# Patient Record
Sex: Female | Born: 1995 | Race: White | Hispanic: No | Marital: Single | Smoking: Never smoker
Health system: Southern US, Community
[De-identification: ages and names within clinical notes are randomized; demographics above are authoritative.]

## PROBLEM LIST (undated history)

## (undated) DIAGNOSIS — L309 Dermatitis, unspecified: Secondary | ICD-10-CM

## (undated) HISTORY — DX: Dermatitis, unspecified: L30.9

---
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
## 2017-12-11 ENCOUNTER — Emergency Department (HOSPITAL_BASED_OUTPATIENT_CLINIC_OR_DEPARTMENT_OTHER): Payer: 59

## 2017-12-11 ENCOUNTER — Encounter (HOSPITAL_BASED_OUTPATIENT_CLINIC_OR_DEPARTMENT_OTHER): Payer: Self-pay | Admitting: *Deleted

## 2017-12-11 ENCOUNTER — Emergency Department (HOSPITAL_BASED_OUTPATIENT_CLINIC_OR_DEPARTMENT_OTHER)
Admission: EM | Admit: 2017-12-11 | Discharge: 2017-12-12 | Disposition: A | Discharge status: Home / Self Care | Payer: 59 | Attending: Emergency Medicine | Admitting: Emergency Medicine

## 2017-12-11 ENCOUNTER — Other Ambulatory Visit: Payer: Self-pay

## 2017-12-11 DIAGNOSIS — Y9301 Activity, walking, marching and hiking: Secondary | ICD-10-CM | POA: Insufficient documentation

## 2017-12-11 DIAGNOSIS — Y999 Unspecified external cause status: Secondary | ICD-10-CM | POA: Diagnosis not present

## 2017-12-11 DIAGNOSIS — R0602 Shortness of breath: Secondary | ICD-10-CM | POA: Diagnosis not present

## 2017-12-11 DIAGNOSIS — Z79899 Other long term (current) drug therapy: Secondary | ICD-10-CM | POA: Diagnosis not present

## 2017-12-11 DIAGNOSIS — Y9289 Other specified places as the place of occurrence of the external cause: Secondary | ICD-10-CM | POA: Diagnosis not present

## 2017-12-11 DIAGNOSIS — R000 Tachycardia, unspecified: Secondary | ICD-10-CM | POA: Insufficient documentation

## 2017-12-11 DIAGNOSIS — R202 Paresthesia of skin: Secondary | ICD-10-CM | POA: Diagnosis not present

## 2017-12-11 DIAGNOSIS — M79672 Pain in left foot: Secondary | ICD-10-CM | POA: Insufficient documentation

## 2017-12-11 DIAGNOSIS — E876 Hypokalemia: Secondary | ICD-10-CM | POA: Insufficient documentation

## 2017-12-11 DIAGNOSIS — X58XXXA Exposure to other specified factors, initial encounter: Secondary | ICD-10-CM | POA: Insufficient documentation

## 2017-12-11 LAB — BASIC METABOLIC PANEL
ANION GAP: 7 (ref 5–15)
BUN: 12 mg/dL (ref 6–20)
CO2: 25 mmol/L (ref 22–32)
CREATININE: 0.8 mg/dL (ref 0.44–1.00)
Calcium: 9.4 mg/dL (ref 8.9–10.3)
Chloride: 101 mmol/L (ref 101–111)
GFR calc non Af Amer: >60 mL/min (ref >60)
Glucose, Bld: 109 mg/dL — ABNORMAL HIGH (ref 65–99)
Potassium: 3.1 mmol/L — ABNORMAL LOW (ref 3.5–5.1)
Sodium: 133 mmol/L — ABNORMAL LOW (ref 135–145)

## 2017-12-11 LAB — CBC WITH DIFFERENTIAL/PLATELET
BASOS ABS: 0 10*3/uL (ref 0.0–0.1)
Basophils Relative: 0 %
Eosinophils Absolute: 0 10*3/uL (ref 0.0–0.7)
Eosinophils Relative: 0 %
HEMATOCRIT: 41.4 % (ref 36.0–46.0)
Hemoglobin: 14.2 g/dL (ref 12.0–15.0)
Lymphocytes Relative: 29 %
Lymphs Abs: 3.4 10*3/uL (ref 0.7–4.0)
MCH: 28.5 pg (ref 26.0–34.0)
MCHC: 34.3 g/dL (ref 30.0–36.0)
MCV: 83.1 fL (ref 78.0–100.0)
Monocytes Absolute: 1.1 10*3/uL — ABNORMAL HIGH (ref 0.1–1.0)
Monocytes Relative: 10 %
NEUTROS ABS: 7.1 10*3/uL (ref 1.7–7.7)
NEUTROS PCT: 61 %
Platelets: 379 10*3/uL (ref 150–400)
RBC: 4.98 MIL/uL (ref 3.87–5.11)
RDW: 12.3 % (ref 11.5–15.5)
WBC: 11.6 10*3/uL — ABNORMAL HIGH (ref 4.0–10.5)

## 2017-12-11 LAB — D-DIMER, QUANTITATIVE (NOT AT ARMC)

## 2017-12-11 MED ORDER — IBUPROFEN 800 MG PO TABS
800.0000 mg | ORAL_TABLET | Freq: Once | ORAL | Status: AC
  Administered 2017-12-11: 800 mg via ORAL
  Filled 2017-12-11: qty 1

## 2017-12-11 MED ORDER — POTASSIUM CHLORIDE CRYS ER 20 MEQ PO TBCR
40.0000 meq | EXTENDED_RELEASE_TABLET | Freq: Once | ORAL | Status: AC
  Administered 2017-12-12: 40 meq via ORAL
  Filled 2017-12-11: qty 2

## 2017-12-11 MED ORDER — SODIUM CHLORIDE 0.9 % IV BOLUS (SEPSIS)
1000.0000 mL | Freq: Once | INTRAVENOUS | Status: AC
  Administered 2017-12-11: 1000 mL via INTRAVENOUS

## 2017-12-11 NOTE — ED Triage Notes (Signed)
Left foot pain after walking in the park.

## 2017-12-11 NOTE — ED Provider Notes (Signed)
[facility] EMERGENCY DEPARTMENT Provider Note   CSN: [id number] Arrival date & time: 12/11/17  2028     History   Chief Complaint Chief Complaint  Patient presents with  . Foot Pain    HPI Laurie Cabrera is a 22 y.o. female.  HPI   22 year old female presenting for evaluation of left foot pain.  Patient reports she was walking with her friend in the park for approximately an hour when she developed pain to her left foot.  Pain is primarily to the ball of the foot radiates towards her toes.  She also complaining of tingling sensation to her toes.  Pain is described as 5 out of 10 currently and states that it has been improving.  She has not had this pain before.  She denies any recent injury.  Denies change in her footwear.  No history of diabetes.  No tenderness to her ankle or calf.  No specific treatment tried.  Past Medical History:  Diagnosis Date  . Eczema     There are no active problems to display for this patient.   History reviewed. No pertinent surgical history.  OB History    Gravida Para Term Preterm AB Living             0   SAB TAB Ectopic Multiple Live Births                   Home Medications    Prior to Admission medications   Medication Sig Start Date End Date Taking? Authorizing Provider  acetaminophen (TYLENOL) 500 MG tablet Take 500 mg by mouth every 6 (six) hours as needed for mild pain or moderate pain.    [provider]  NIKKI 3-0.02 MG tablet Take 1 tablet by mouth daily. 08/21/16   [provider]  triamcinolone cream (KENALOG) 0.1 % Apply 1 application topically 2 (two) times daily. 05/12/14   Junie SpencerHawks, Christy A, FNP    Family History Family History  Problem Relation Age of Onset  . Hypertension Mother   . Seizures Brother   . Healthy Brother     Social History Social History   Tobacco Use  . Smoking status: Never Smoker  . Smokeless tobacco: Never Used  Substance Use Topics  . Alcohol use: No  . Drug  use: No     Allergies   Patient has no known allergies.   Review of Systems Review of Systems  Constitutional: Negative for fever.  Skin: Negative for wound.  Neurological: Positive for numbness.     Physical Exam Updated Vital Signs BP (!) 144/57   Pulse (!) 120   Temp 98.1 F (36.7 C) (Oral)   Resp 18   Ht 5\' 4"  (1.626 m)   Wt 68 kg (150 lb)   LMP 12/07/2017   SpO2 100%   BMI 25.75 kg/m   Physical Exam  Constitutional: She appears well-developed and well-nourished. No distress.  HENT:  Head: Atraumatic.  Eyes: Conjunctivae are normal.  Neck: Neck supple.  Musculoskeletal: She exhibits tenderness (Left foot: Tenderness to left distal toes including left great toe on palpation with brisk cap refill and intact dorsalis pedis pulse.  No overlying skin changes, no bruising or deformity so.  Left ankle nontender).  Neurological: She is alert.  Skin: No rash noted.  Psychiatric: She has a normal mood and affect.  Nursing note and vitals reviewed.    ED Treatments / Results  Labs (all labs ordered are listed, but  only abnormal results are displayed) Labs Reviewed  CBC WITH DIFFERENTIAL/PLATELET - Abnormal; Notable for the following components:      Result Value   WBC 11.6 (*)    Monocytes Absolute 1.1 (*)    All other components within normal limits  BASIC METABOLIC PANEL - Abnormal; Notable for the following components:   Sodium 133 (*)    Potassium 3.1 (*)    Glucose, Bld 109 (*)    All other components within normal limits  D-DIMER, QUANTITATIVE (NOT AT [facility])  HCG, QUANTITATIVE, PREGNANCY    EKG  EKG Interpretation None       Radiology Dg Foot Complete Left  Result Date: 12/11/2017 CLINICAL DATA:  Pain second through fifth toes EXAM: LEFT FOOT - COMPLETE 3+ VIEW COMPARISON:  04/13/2013 FINDINGS: There is no evidence of fracture or dislocation. There is no evidence of arthropathy or other focal bone abnormality. Soft tissues are unremarkable.  IMPRESSION: Negative. Electronically Signed   By: Jasmine Pang M.D.   On: 12/11/2017 21:15    Procedures Procedures (including critical care time)  Medications Ordered in ED Medications  ibuprofen (ADVIL,MOTRIN) tablet 800 mg (800 mg Oral Given 12/11/17 2225)  sodium chloride 0.9 % bolus 1,000 mL (1,000 mLs Intravenous New Bag/Given 12/11/17 2327)  potassium chloride SA (K-DUR,KLOR-CON) CR tablet 40 mEq (40 mEq Oral Given 12/12/17 0005)     Initial Impression / Assessment and Plan / ED Course  I have reviewed the triage vital signs and the nursing notes.  Pertinent labs & imaging results that were available during my care of the patient were reviewed by me and considered in my medical decision making (see chart for details).     BP (!) 127/92 (BP Location: Right Arm)   Pulse (!) 127   Temp 98.5 F (36.9 C) (Oral)   Resp 17   Ht 5\' 4"  (1.626 m)   Wt 68 kg (150 lb)   LMP 12/07/2017   SpO2 100%   BMI 25.75 kg/m    Final Clinical Impressions(s) / ED Diagnoses   Final diagnoses:  Left foot pain  Hypokalemia    ED Discharge Orders        Ordered    ibuprofen (ADVIL,MOTRIN) 600 MG tablet  Every 6 hours PRN     12/12/17 0023    potassium chloride SA (K-DUR,KLOR-CON) 20 MEQ tablet  Daily     12/12/17 0023     10:16 PM Patient here with pain to her left foot.  This is atraumatic pain and she also complaining of tingling sensation to her toes.  She does have sensation on palpation.  She is neurovascularly intact with brisk cap refill to her toes.  No ankle pain, and her left Nontender and No Compartment Syndrome.  An X-Ray of Her Left Foot Is without Acute Abnormalities.  At This Time I Do Not See Any Infectious Etiology and I Am Not concerning for limb threatening injury.  Rice therapy discussed.  Return precautions discussed.  11:12 PM Patient remains tachycardic with current heart rate 125.  She is not any oral hormone, no recent surgery, prolonged bedrest, active cancer or  hemoptysis however she did report traveling from [place] to here approximately 3 weeks ago.  Since then she has been having some trouble catching her breath and family member has mentioned that she is breathing heavier than normal.  She denies any prior history of PE or DVT.  Given her tachycardia without any obvious source, my plan is to obtain  basic labs, pregnancy test, give IV fluid and obtain d-dimer.  If d-dimer is normal, patient will be stable for discharge.  If abnormal she will need chest CT angiogram to rule out PE causing her tachycardia and trouble breathing.  Care discussed with Dr. Elesa Massed.  12:03 AM Mild hypokalemia with a potassium of 3.1.  Supplementation given.  Fortunately her d-dimer is negative therefore low suspicion for PE.  She still remains tachycardic.  We will continue with IVF hydration.  She does not have fever or any infectious symptoms.  No history of thyroid disease.  12:21 AM On rate check, heart rate improved to 99 after half a liter of normal saline.  I encourage patient to stay hydrated.  Recommend eating a banana daily for the next week to help with her low potassium which can also cause her to have muscle pain and contribute to her current symptom.  Low suspicion for PE now.  She is stable for discharge.  Return precautions discussed.   Fayrene Helper, PA-C 12/12/17 0024    Tegeler, Canary Brim, MD 12/12/17 [phone]

## 2017-12-12 LAB — HCG, QUANTITATIVE, PREGNANCY: hCG, Beta Chain, Quant, S: <1 m[IU]/mL (ref <5)

## 2017-12-12 MED ORDER — IBUPROFEN 600 MG PO TABS: 600.0000 mg | ORAL_TABLET | Freq: Four times a day (QID) | ORAL | 0 refills | Status: AC | PRN

## 2017-12-12 MED ORDER — POTASSIUM CHLORIDE CRYS ER 20 MEQ PO TBCR: 20.0000 meq | EXTENDED_RELEASE_TABLET | Freq: Every day | ORAL | 0 refills | Status: AC

## 2019-05-04 IMAGING — CR DG FOOT COMPLETE 3+V*L*
3 series · 3 of 3 positions shown · non-contrast
Comparison: 04/13/2013

CLINICAL DATA: Pain second through fifth toes

EXAM:
LEFT FOOT - COMPLETE 3+ VIEW

[t foot ap left]
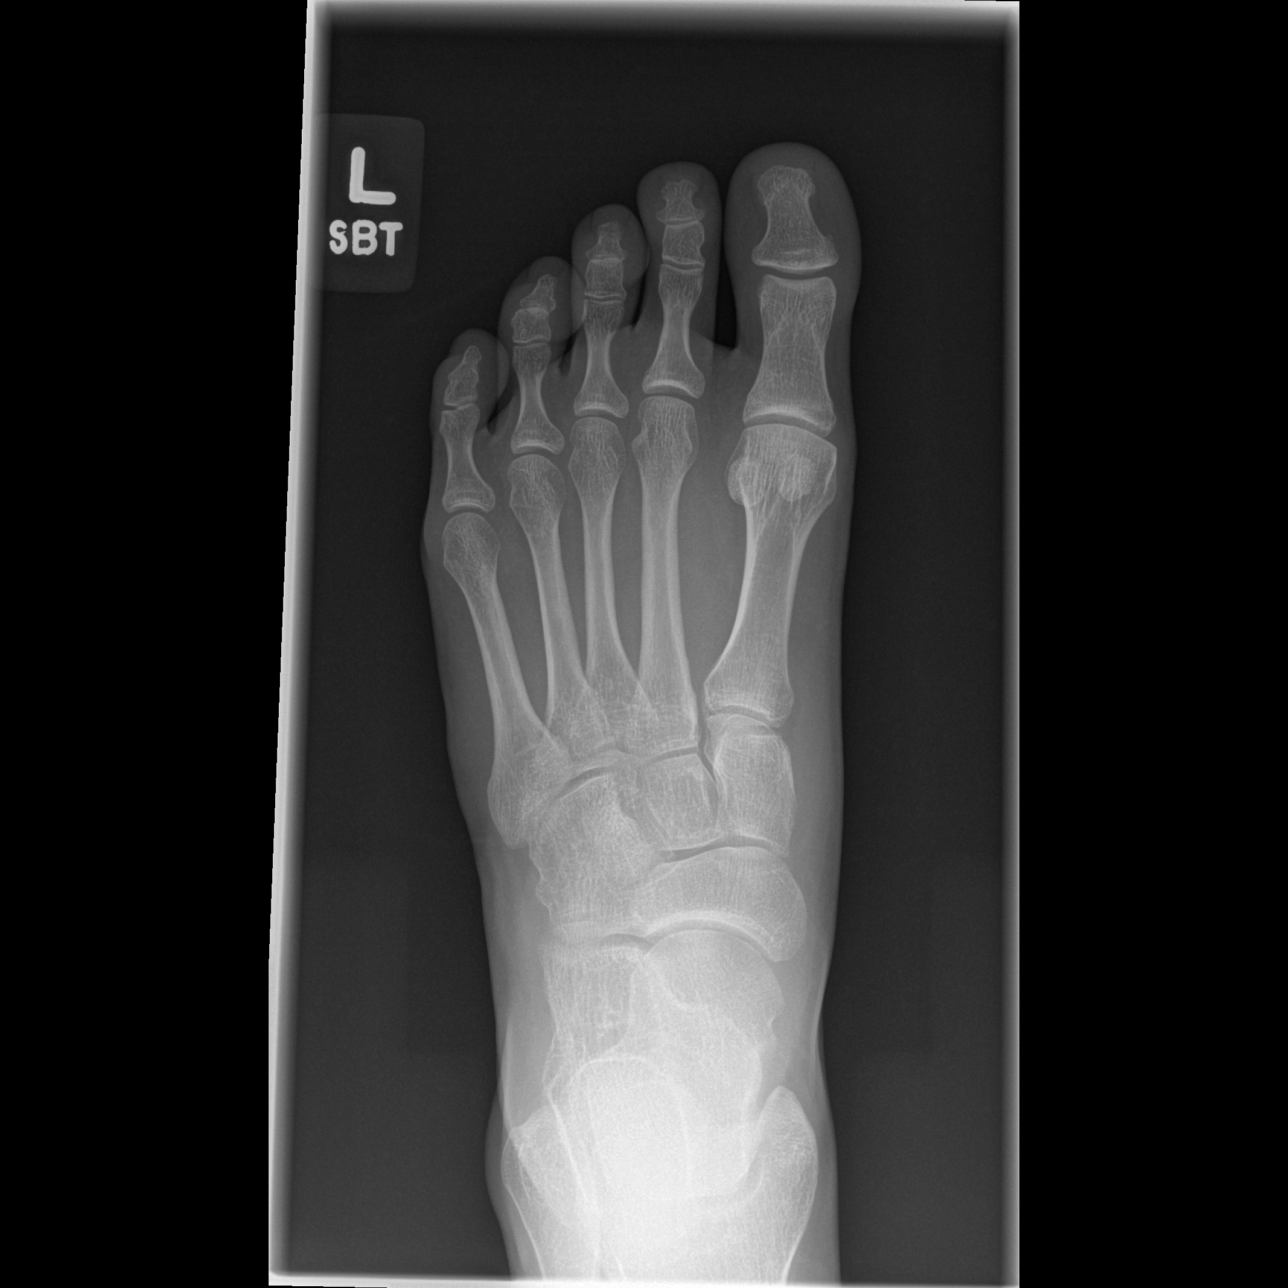

[t foot oblique left]
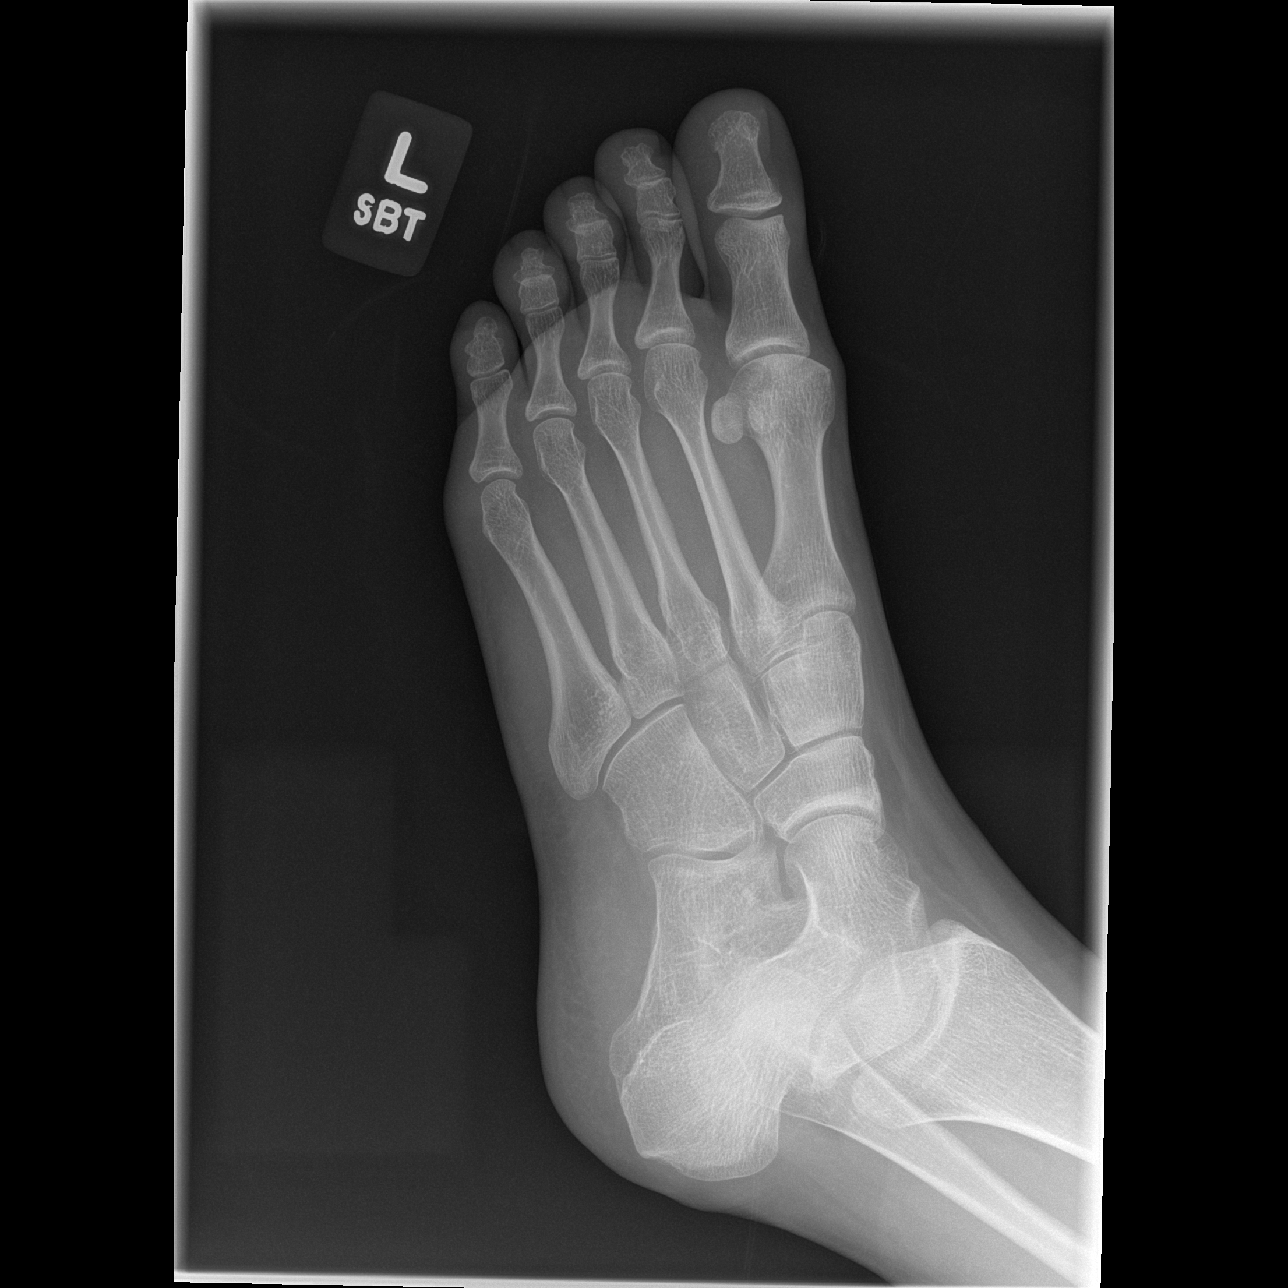

[t foot lat left]
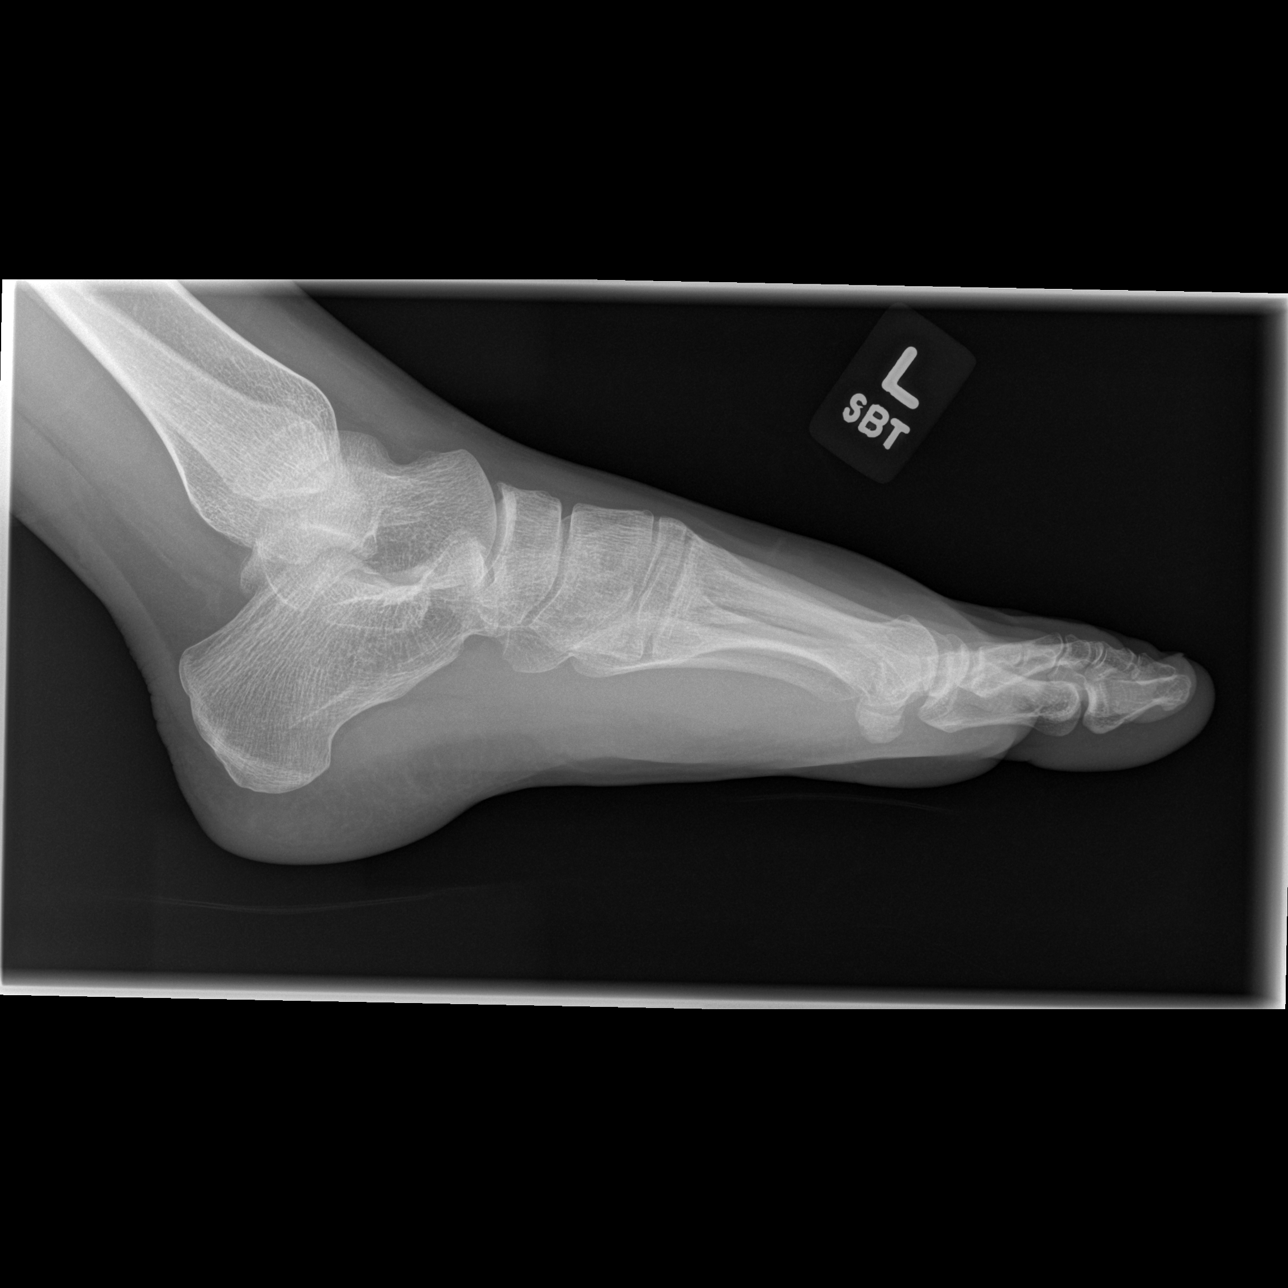

[3 of 3 positions shown; findings below may reference images not displayed]

FINDINGS: There is no evidence of fracture or dislocation. There is no
evidence of arthropathy or other focal bone abnormality. Soft
tissues are unremarkable.
IMPRESSION: Negative.
